# Patient Record
Sex: Male | Born: 1990 | Race: White | Hispanic: No | Marital: Single | Smoking: Current every day smoker
Health system: Southern US, Community
[De-identification: ages and names within clinical notes are randomized; demographics above are authoritative.]

## PROBLEM LIST (undated history)

## (undated) DIAGNOSIS — R011 Cardiac murmur, unspecified: Secondary | ICD-10-CM

---

## 2019-08-19 ENCOUNTER — Emergency Department: Payer: Self-pay

## 2019-08-19 ENCOUNTER — Encounter: Payer: Self-pay | Admitting: Emergency Medicine

## 2019-08-19 ENCOUNTER — Emergency Department
Admission: EM | Admit: 2019-08-19 | Discharge: 2019-08-19 | Disposition: A | Discharge status: Home / Self Care | Payer: Self-pay | Attending: Emergency Medicine | Admitting: Emergency Medicine

## 2019-08-19 ENCOUNTER — Other Ambulatory Visit: Payer: Self-pay

## 2019-08-19 DIAGNOSIS — W228XXA Striking against or struck by other objects, initial encounter: Secondary | ICD-10-CM | POA: Insufficient documentation

## 2019-08-19 DIAGNOSIS — Y9389 Activity, other specified: Secondary | ICD-10-CM | POA: Insufficient documentation

## 2019-08-19 DIAGNOSIS — Y998 Other external cause status: Secondary | ICD-10-CM | POA: Insufficient documentation

## 2019-08-19 DIAGNOSIS — F1210 Cannabis abuse, uncomplicated: Secondary | ICD-10-CM | POA: Insufficient documentation

## 2019-08-19 DIAGNOSIS — F17200 Nicotine dependence, unspecified, uncomplicated: Secondary | ICD-10-CM | POA: Insufficient documentation

## 2019-08-19 DIAGNOSIS — S60221A Contusion of right hand, initial encounter: Secondary | ICD-10-CM | POA: Insufficient documentation

## 2019-08-19 DIAGNOSIS — S60511A Abrasion of right hand, initial encounter: Secondary | ICD-10-CM

## 2019-08-19 DIAGNOSIS — Y929 Unspecified place or not applicable: Secondary | ICD-10-CM | POA: Insufficient documentation

## 2019-08-19 HISTORY — DX: Cardiac murmur, unspecified: R01.1

## 2019-08-19 MED ORDER — MELOXICAM 15 MG PO TABS
15.0000 mg | ORAL_TABLET | Freq: Every day | ORAL | 0 refills | Status: AC
Start: 2019-08-19 — End: ?

## 2019-08-19 MED ORDER — CEPHALEXIN 500 MG PO CAPS: 500.0000 mg | ORAL_CAPSULE | Freq: Two times a day (BID) | ORAL | 0 refills | Status: AC

## 2019-08-19 NOTE — ED Provider Notes (Signed)
[facility] Emergency Department Provider Note  ____________________________________________  Time seen: Approximately 7:13 PM  I have reviewed the triage vital signs and the nursing notes.   HISTORY  Chief Complaint Hand Injury    HPI Charles Howell is a 29 y.o. male who presents the emergency department complaining of right hand injury/pain.  Patient states that he was remodeling a hands, felt very frustrated and took a swing at a door jam.  Patient sustained a superficial laceration across the knuckle and is having pain and swelling to the medial right hand.  Patient is able to move all digits appropriately.  Patient denies any other injury or complaints.  No medications prior to arrival.         Past Medical History:  Diagnosis Date  . Heart murmur     There are no problems to display for this patient.   History reviewed. No pertinent surgical history.  Prior to Admission medications   Medication Sig Start Date End Date Taking? Authorizing Provider  cephALEXin (KEFLEX) 500 MG capsule Take 1 capsule (500 mg total) by mouth 2 (two) times daily for 7 days. 08/19/19 08/26/19  Sean Macwilliams, Delorise Royals, PA-C  meloxicam (MOBIC) 15 MG tablet Take 1 tablet (15 mg total) by mouth daily. 08/19/19   Sully Manzi, Delorise Royals, PA-C    Allergies Patient has no known allergies.  History reviewed. No pertinent family history.  Social History Social History   Tobacco Use  . Smoking status: Current Every Day Smoker  . Smokeless tobacco: Never Used  Substance Use Topics  . Alcohol use: Never  . Drug use: Yes    Types: Marijuana     Review of Systems  Constitutional: No fever/chills Eyes: No visual changes. No discharge ENT: No upper respiratory complaints. Cardiovascular: no chest pain. Respiratory: no cough. No SOB. Gastrointestinal: No abdominal pain.  No nausea, no vomiting.  No diarrhea.  No constipation. Musculoskeletal: Positive for right hand  injury.  Skin: Negative for rash, abrasions, lacerations, ecchymosis. Neurological: Negative for headaches, focal weakness or numbness. 10-point ROS otherwise negative.  ____________________________________________   PHYSICAL EXAM:  VITAL SIGNS: ED Triage Vitals  Enc Vitals Group     BP 08/19/19 1741 (!) 157/74     Pulse Rate 08/19/19 1741 87     Resp 08/19/19 1741 16     Temp 08/19/19 1741 98 F (36.7 C)     Temp Source 08/19/19 1741 Oral     SpO2 08/19/19 1741 98 %     Weight 08/19/19 1742 180 lb (81.6 kg)     Height 08/19/19 1742 5\' 11"  (1.803 m)     Head Circumference --      Peak Flow --      Pain Score 08/19/19 1742 6     Pain Loc --      Pain Edu? --      Excl. in GC? --      Constitutional: Alert and oriented. Well appearing and in no acute distress. Eyes: Conjunctivae are normal. PERRL. EOMI. Head: Atraumatic. ENT:      Ears:       Nose: No congestion/rhinnorhea.      Mouth/Throat: Mucous membranes are moist.  Neck: No stridor.    Cardiovascular: Normal rate, regular rhythm. Normal S1 and S2.  Good peripheral circulation. Respiratory: Normal respiratory effort without tachypnea or retractions. Lungs CTAB. Good air entry to the bases with no decreased or absent breath sounds. Musculoskeletal: Full range of motion to all extremities. No  gross deformities appreciated.  Visualization of the right hand reveals mild edema along the fifth metacarpal.  No deformity.  Good range of motion all digits.  Superficial laceration across the dorsal aspect of the MCP joint fifth digit.  No other soft tissue injury noted.  Sensation intact all 5 digits.  Capillary refill less than 2 seconds all digits. Neurologic:  Normal speech and language. No gross focal neurologic deficits are appreciated.  Skin:  Skin is warm, dry and intact. No rash noted. Psychiatric: Mood and affect are normal. Speech and behavior are normal. Patient exhibits appropriate insight and  judgement.   ____________________________________________   LABS (all labs ordered are listed, but only abnormal results are displayed)  Labs Reviewed - No data to display ____________________________________________  EKG   ____________________________________________  RADIOLOGY I personally viewed and evaluated these images as part of my medical decision making, as well as reviewing the written report by the radiologist.  DG Hand Complete Right  Result Date: 08/19/2019 CLINICAL DATA:  Swelling after trauma EXAM: RIGHT HAND - COMPLETE 3+ VIEW COMPARISON:  None. FINDINGS: Mild deformity of the fifth metacarpal without an acute fracture line identified suggest a previous remote healed fracture. Well corticated calcifications adjacent to the proximal fifth metacarpal appear to be nonacute as well, likely sequela of previous trauma. No acute fractures are seen. IMPRESSION: No acute fractures. Mild deformity of the fifth metacarpal and soft tissue calcifications adjacent to the base of the fifth metacarpal are likely from previous remote healed trauma. Electronically Signed   By: Gerome Sam III M.D   On: 08/19/2019 18:31    ____________________________________________    PROCEDURES  Procedure(s) performed:    Procedures    Medications - No data to display   ____________________________________________   INITIAL IMPRESSION / ASSESSMENT AND PLAN / ED COURSE  Pertinent labs & imaging results that were available during my care of the patient were reviewed by me and considered in my medical decision making (see chart for details).  Review of the [place] CSRS was performed in accordance of the [facility] prior to dispensing any controlled drugs.           Patient's diagnosis is consistent with hand contusion superficial laceration of the hand.  Patient presented to emergency department after becoming frustrated, striking a door jam with a closed fist.  Imaging reveals remote  well-healed fracture but no new fractures identified.  Patient had a superficial laceration across the knuckle.  This was cleansed and dressed in the emergency department.  This did not require closure.  Patient was placed on anti-inflammatory for symptom relief of pain and swelling, patient will have antibiotics prophylactically given the superficial laceration over the joint..  Follow-up primary care as needed.  Patient is given ED precautions to return to the ED for any worsening or new symptoms.     ____________________________________________  FINAL CLINICAL IMPRESSION(S) / ED DIAGNOSES  Final diagnoses:  Contusion of right hand, initial encounter  Abrasion of right hand, initial encounter      NEW MEDICATIONS STARTED DURING THIS VISIT:  ED Discharge Orders         Ordered    meloxicam (MOBIC) 15 MG tablet  Daily     08/19/19 1950    cephALEXin (KEFLEX) 500 MG capsule  2 times daily     08/19/19 1950              This chart was dictated using voice recognition software/Dragon. Despite best efforts to proofread, errors can  occur which can change the meaning. Any change was purely unintentional.    Darletta Moll, PA-C 08/19/19 1950    Carrie Mew, MD 08/19/19 2211

## 2019-08-19 NOTE — ED Triage Notes (Signed)
Pt punched door frame.  Swelling to right hand. No deformity.

## 2019-08-19 NOTE — ED Notes (Signed)
Per EDP, pt's ride arrived and pt could not wait. EDP Christiane Ha gave pt paperwork and discharge pt. No vitals or signature obtained. Pt left to lobby per EDP.

## 2019-11-29 ENCOUNTER — Emergency Department
Admission: EM | Admit: 2019-11-29 | Discharge: 2019-11-29 | Disposition: A | Discharge status: Home / Self Care | Payer: Self-pay | Attending: Emergency Medicine | Admitting: Emergency Medicine

## 2019-11-29 ENCOUNTER — Encounter: Payer: Self-pay | Admitting: Emergency Medicine

## 2019-11-29 ENCOUNTER — Other Ambulatory Visit: Payer: Self-pay

## 2019-11-29 DIAGNOSIS — Y9389 Activity, other specified: Secondary | ICD-10-CM | POA: Insufficient documentation

## 2019-11-29 DIAGNOSIS — W540XXA Bitten by dog, initial encounter: Secondary | ICD-10-CM | POA: Insufficient documentation

## 2019-11-29 DIAGNOSIS — Y9289 Other specified places as the place of occurrence of the external cause: Secondary | ICD-10-CM | POA: Insufficient documentation

## 2019-11-29 DIAGNOSIS — S61012A Laceration without foreign body of left thumb without damage to nail, initial encounter: Secondary | ICD-10-CM | POA: Insufficient documentation

## 2019-11-29 DIAGNOSIS — Y998 Other external cause status: Secondary | ICD-10-CM | POA: Insufficient documentation

## 2019-11-29 NOTE — ED Triage Notes (Signed)
While playing with 3 week old [place] puppy, puppy clamped down on left thumb.  Laceration to left thumb.  Bleeding controlled.  DSD applied.

## 2021-07-29 ENCOUNTER — Emergency Department: Payer: Self-pay

## 2021-07-29 ENCOUNTER — Encounter: Payer: Self-pay | Admitting: Emergency Medicine

## 2021-07-29 DIAGNOSIS — Z5321 Procedure and treatment not carried out due to patient leaving prior to being seen by health care provider: Secondary | ICD-10-CM | POA: Insufficient documentation

## 2021-07-29 DIAGNOSIS — R079 Chest pain, unspecified: Secondary | ICD-10-CM | POA: Insufficient documentation

## 2021-07-29 NOTE — ED Triage Notes (Addendum)
Pt presents via POV with left sided chest pain that started 20 mins ago that radiates towards his left arm. Pt endorses familial hx of MI. He also notes pain and swelling to his left hand - that started last week. Denies Sob. ?

## 2021-07-29 NOTE — ED Notes (Addendum)
Pt refused labs. EDP Darnelle Catalan) made aware. ?

## 2021-07-30 ENCOUNTER — Emergency Department
Admission: EM | Admit: 2021-07-30 | Discharge: 2021-07-30 | Disposition: A | Discharge status: Home / Self Care | Payer: Self-pay | Attending: Emergency Medicine | Admitting: Emergency Medicine

## 2021-09-05 IMAGING — CR DG HAND COMPLETE 3+V*R*
3 series · 4 of 4 positions shown · non-contrast
Comparison: None.

CLINICAL DATA: Swelling after trauma

EXAM:
RIGHT HAND - COMPLETE 3+ VIEW

[hand ap]
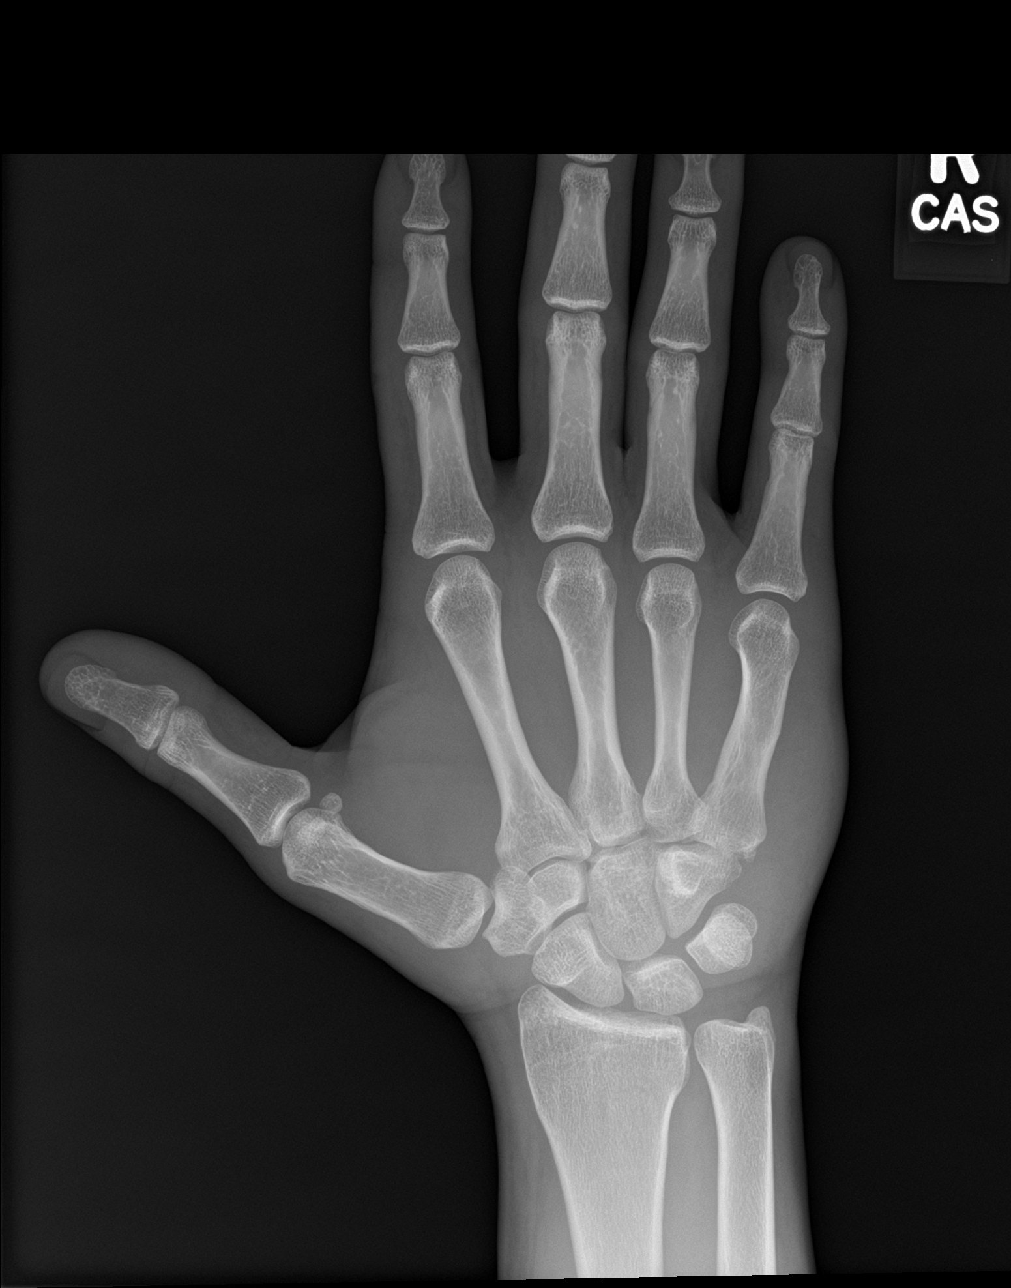

[Series 2: hand obl · 0.14mm/px · 2 of 2 slices shown]
[im 1/2]
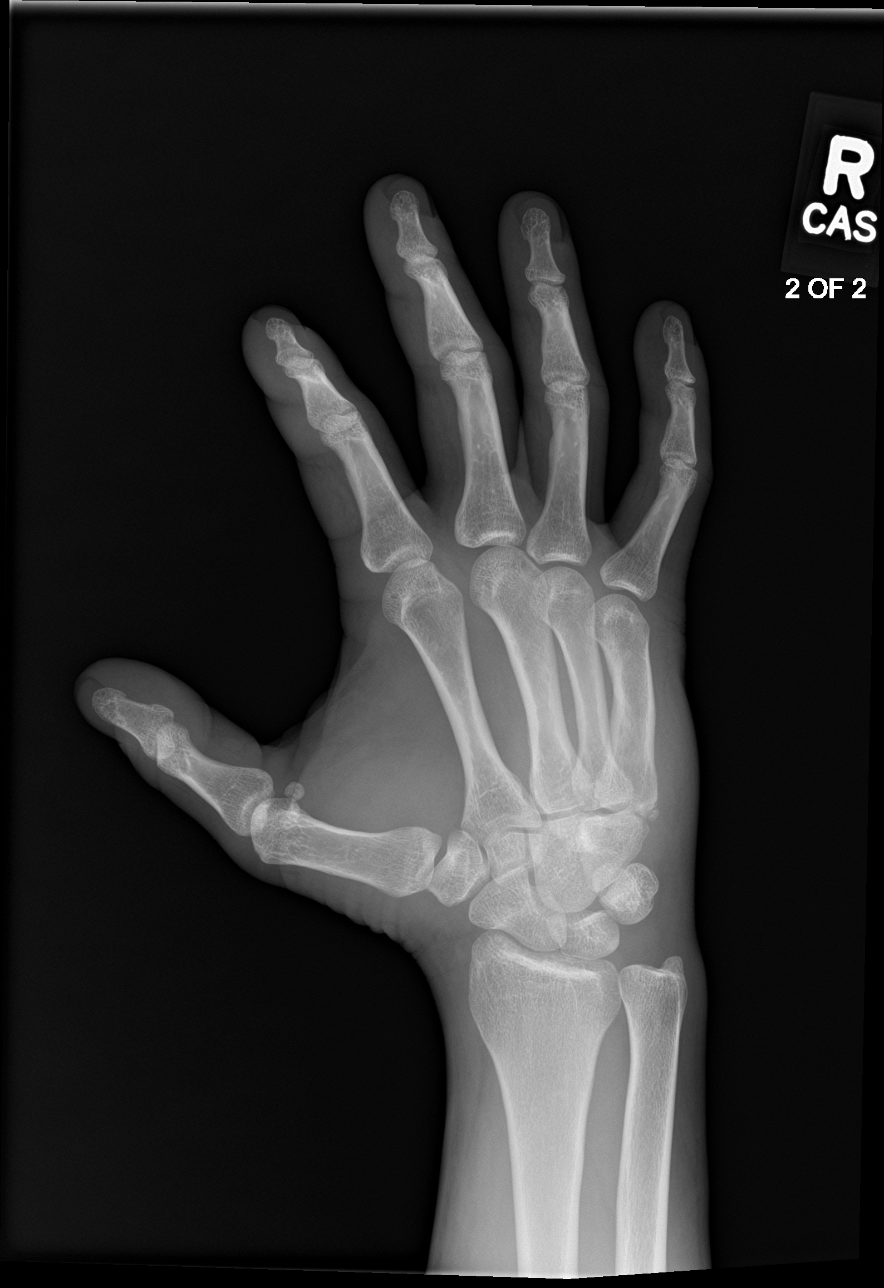
[im 2/2]
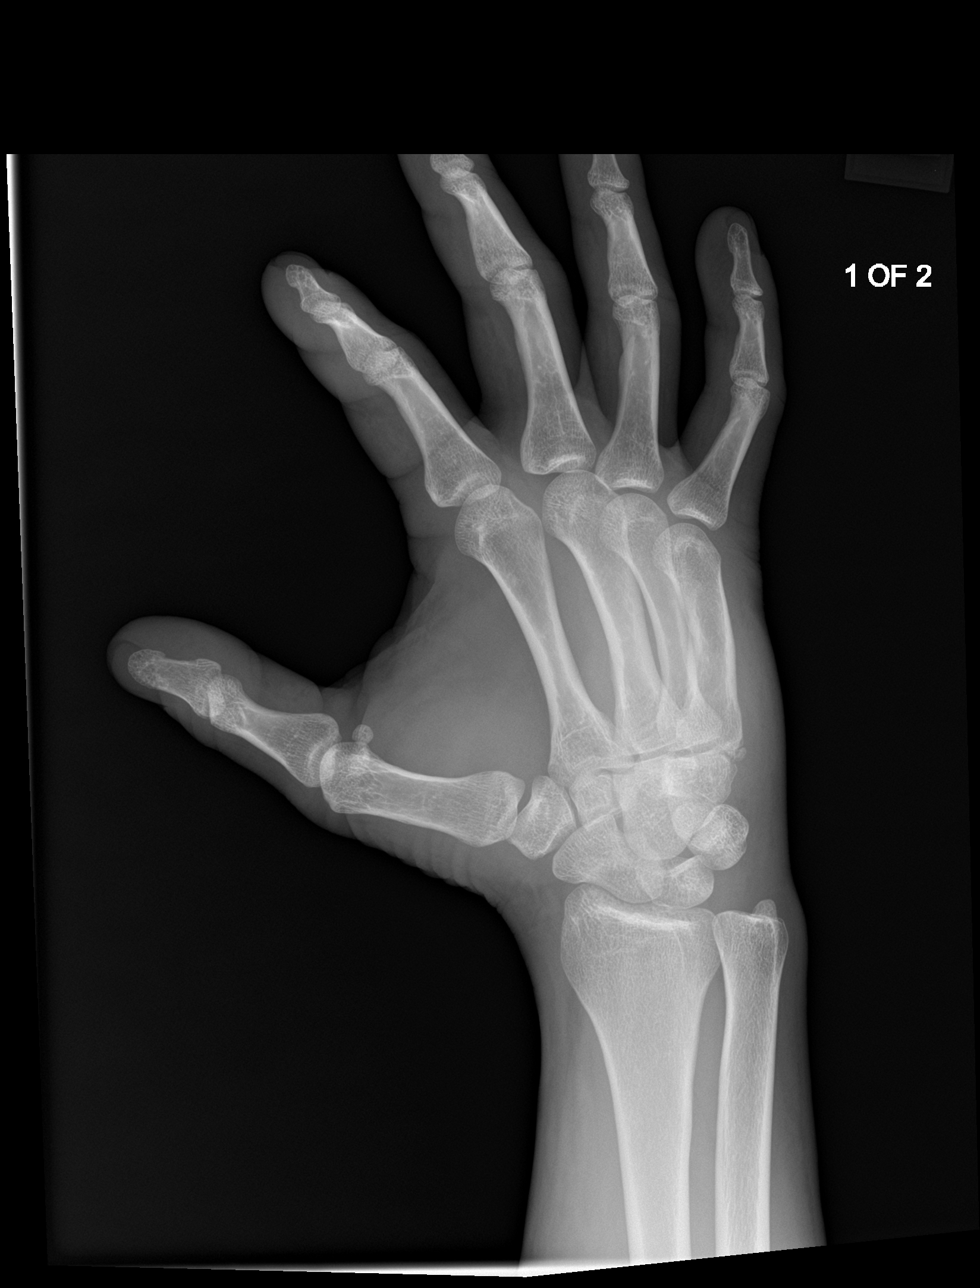

[hand lat]
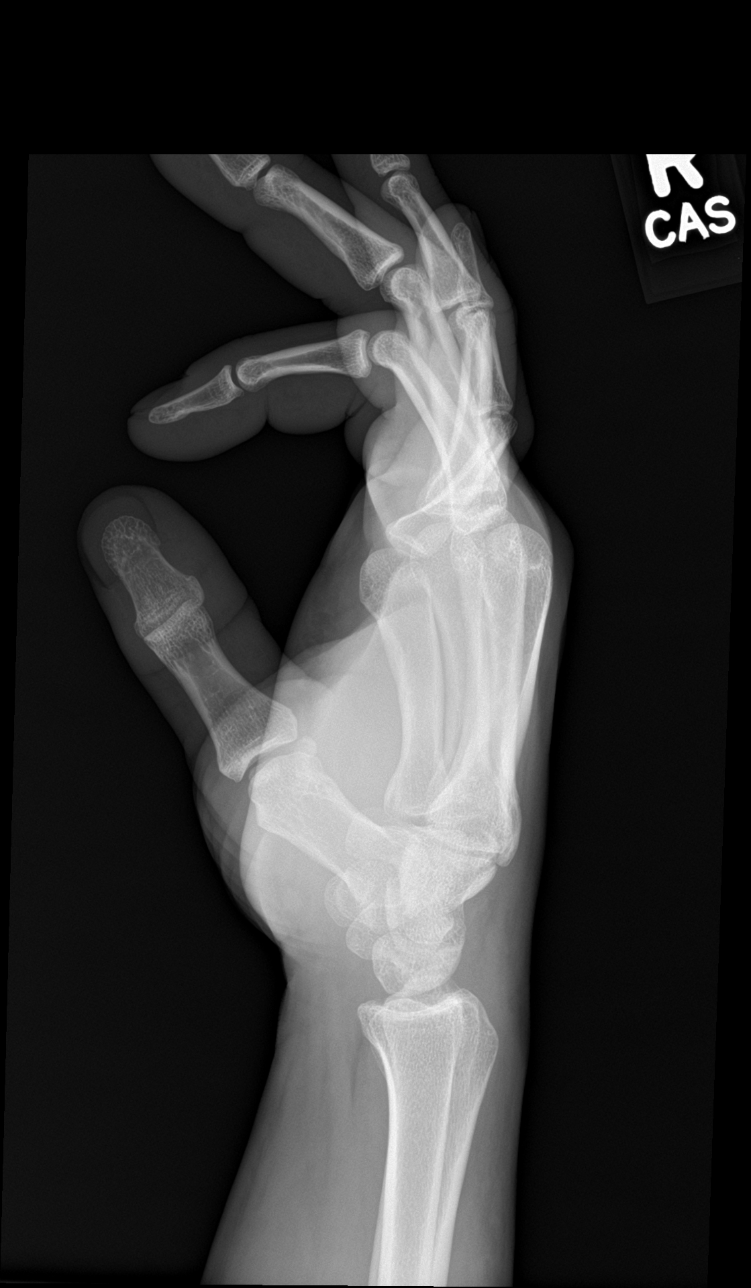

[4 of 4 positions shown; findings below may reference images not displayed]

FINDINGS: Mild deformity of the fifth metacarpal without an acute fracture
line identified suggest a previous remote healed fracture. Well
corticated calcifications adjacent to the proximal fifth metacarpal
appear to be nonacute as well, likely sequela of previous trauma. No
acute fractures are seen.
IMPRESSION: No acute fractures. Mild deformity of the fifth metacarpal and soft
tissue calcifications adjacent to the base of the fifth metacarpal
are likely from previous remote healed trauma.
